# Patient Record
Sex: Male | Born: 1955
Health system: Southern US, Community
[De-identification: ages and names within clinical notes are randomized; demographics above are authoritative.]

## PROBLEM LIST (undated history)

## (undated) DIAGNOSIS — I10 Essential (primary) hypertension: Secondary | ICD-10-CM

## (undated) DIAGNOSIS — I509 Heart failure, unspecified: Secondary | ICD-10-CM

## (undated) DIAGNOSIS — J45909 Unspecified asthma, uncomplicated: Secondary | ICD-10-CM

## (undated) DIAGNOSIS — E7800 Pure hypercholesterolemia, unspecified: Secondary | ICD-10-CM

## (undated) DIAGNOSIS — M1990 Unspecified osteoarthritis, unspecified site: Secondary | ICD-10-CM

## (undated) HISTORY — PX: CORONARY ANGIOPLASTY WITH STENT PLACEMENT: SHX49

---

## 2018-04-04 ENCOUNTER — Encounter (HOSPITAL_COMMUNITY): Payer: Self-pay | Admitting: Emergency Medicine

## 2018-04-04 ENCOUNTER — Emergency Department (HOSPITAL_COMMUNITY)
Admission: EM | Admit: 2018-04-04 | Discharge: 2018-04-04 | Disposition: A | Discharge status: Home / Self Care | Payer: Self-pay | Attending: Emergency Medicine | Admitting: Emergency Medicine

## 2018-04-04 DIAGNOSIS — I110 Hypertensive heart disease with heart failure: Secondary | ICD-10-CM | POA: Insufficient documentation

## 2018-04-04 DIAGNOSIS — J45909 Unspecified asthma, uncomplicated: Secondary | ICD-10-CM | POA: Insufficient documentation

## 2018-04-04 DIAGNOSIS — I509 Heart failure, unspecified: Secondary | ICD-10-CM | POA: Insufficient documentation

## 2018-04-04 DIAGNOSIS — F17210 Nicotine dependence, cigarettes, uncomplicated: Secondary | ICD-10-CM | POA: Insufficient documentation

## 2018-04-04 DIAGNOSIS — G510 Bell's palsy: Secondary | ICD-10-CM | POA: Insufficient documentation

## 2018-04-04 HISTORY — DX: Heart failure, unspecified: I50.9

## 2018-04-04 HISTORY — DX: Essential (primary) hypertension: I10

## 2018-04-04 HISTORY — DX: Unspecified osteoarthritis, unspecified site: M19.90

## 2018-04-04 HISTORY — DX: Unspecified asthma, uncomplicated: J45.909

## 2018-04-04 HISTORY — DX: Pure hypercholesterolemia, unspecified: E78.00

## 2018-04-04 MED ORDER — LISINOPRIL 30 MG PO TABS: 30.0000 mg | ORAL_TABLET | Freq: Every day | ORAL | 0 refills | Status: DC

## 2018-04-04 MED ORDER — METOPROLOL TARTRATE 25 MG PO TABS: 25.0000 mg | ORAL_TABLET | Freq: Two times a day (BID) | ORAL | 0 refills | Status: DC

## 2018-04-04 MED ORDER — PREDNISONE 20 MG PO TABS: 60.0000 mg | ORAL_TABLET | Freq: Every day | ORAL | 0 refills | Status: AC

## 2018-04-04 MED ORDER — VALACYCLOVIR HCL 1 G PO TABS: 1000.0000 mg | ORAL_TABLET | Freq: Three times a day (TID) | ORAL | 0 refills | Status: AC

## 2018-04-04 MED ORDER — ALBUTEROL SULFATE HFA 108 (90 BASE) MCG/ACT IN AERS: 1.0000 | INHALATION_SPRAY | Freq: Four times a day (QID) | RESPIRATORY_TRACT | 0 refills | Status: AC | PRN

## 2018-04-04 NOTE — Discharge Instructions (Signed)
You were seen in the ED today with weakness in the left face. This is not from a stroke and is likely due to a viral infection causing the weakness. We are starting anti-viral medication, steroids, and have discussed eye care. You will need to follow up with a primary care physician and Neurologist as listed. I am refilling some of your medications until you are able to see your PCP. Tape the eye closed at night as demonstrated here.   Return to the ED with any severe eye pain, vision, change, worsening symptoms, or of your weakness spreads to the left arm or leg. You should also return if numbness develops.

## 2018-04-04 NOTE — ED Notes (Signed)
Pt stable, ambulatory, states understanding of discharge instructions 

## 2018-04-04 NOTE — ED Provider Notes (Signed)
Emergency Department Provider Note   I have reviewed the triage vital signs and the nursing notes.   HISTORY  Chief Complaint Facial Swelling   HPI Jacob Hopkins is a 62 y.o. male with PMH of HTN, HLD, and asthma [place] to the emergency department with 3 to 4 days of acute onset left face weakness.  Symptoms began suddenly without obvious provoking factors.  Patient does not have numbness to the left face.  He is not experiencing any weakness or numbness in the left arm or leg.  He describes some tightness over his shoulders and right leg.  Patient recently moved from [place] and has not established with a primary care physician.  He has no prior history of stroke.  He does feel like it is very difficult to close his left eye and that may be drying out.  He has not experienced any vision changes or drainage from the eye.  He also finds that liquids are falling from his mouth when drinking from the left side.  Ear discomfort. No rash.   Past Medical History:  Diagnosis Date  . Arthritis   . Asthma   . CHF (congestive heart failure) (HCC)   . High cholesterol   . Hypertension     There are no active problems to display for this patient.   Allergies Patient has no known allergies.  No family history on file.  Social History Social History   Tobacco Use  . Smoking status: Current Every Day Smoker  . Smokeless tobacco: Never Used  Substance Use Topics  . Alcohol use: Not on file  . Drug use: Not on file    Review of Systems  Constitutional: No fever/chills Eyes: No visual changes. Positive left eye dryness.  ENT: No sore throat. Cardiovascular: Denies chest pain. Respiratory: Denies shortness of breath. Gastrointestinal: No abdominal pain.  No nausea, no vomiting.  No diarrhea.  No constipation. Genitourinary: Negative for dysuria. Musculoskeletal: Negative for back pain. Skin: Negative for rash. Neurological: Negative for headaches or numbness. Positive left  face weakness.   10-point ROS otherwise negative.  ____________________________________________   PHYSICAL EXAM:  VITAL SIGNS: ED Triage Vitals  Enc Vitals Group     BP 04/04/18 1240 (!) 167/103     Pulse Rate 04/04/18 1240 80     Resp 04/04/18 1240 16     Temp 04/04/18 1240 98.7 F (37.1 C)     Temp Source 04/04/18 1240 Oral     SpO2 04/04/18 1240 99 %     Pain Score 04/04/18 1239 7   Constitutional: Alert and oriented. Well appearing and in no acute distress. Eyes: Conjunctivae are normal. PERRL. EOMI. Unable to close the left eye.  Head: Atraumatic. Nose: No congestion/rhinnorhea. Mouth/Throat: Mucous membranes are moist.   Neck: No stridor.   Cardiovascular: Normal rate, regular rhythm. Good peripheral circulation. Grossly normal heart sounds.   Respiratory: Normal respiratory effort.  No retractions. Lungs CTAB. Gastrointestinal: Soft and nontender. No distention.  Musculoskeletal: No lower extremity tenderness nor edema. No gross deformities of extremities. Neurologic: Patient with dense left face weakness involving the forehead and left eyebrows. Normal sensation. Normal tongue protrusion. Unable to close the left eye. Normal strength and sensation in the left upper and lower extremity. No pronator drift. Normal finger-to-nose testing. Normal gait.  Skin:  Skin is warm, dry and intact. No rash noted. ____________________________________________  EKG   EKG Interpretation  Date/Time:  Sunday April 04 2018 12:51:11 EST Ventricular Rate:  40  PR Interval:  126 QRS Duration: 86 QT Interval:  386 QTC Calculation: 440 R Axis:   69 Text Interpretation:  Normal sinus rhythm Possible Left atrial enlargement Left ventricular hypertrophy Abnormal ECG No STEMI.  Confirmed by Alona BeneLong, Jaylenne Hamelin [phone] on 04/04/2018 12:56:36 PM       ____________________________________________   PROCEDURES  Procedure(s) performed:    Procedures  None ____________________________________________   INITIAL IMPRESSION / ASSESSMENT AND PLAN / ED COURSE  Pertinent labs & imaging results that were available during my care of the patient were reviewed by me and considered in my medical decision making (see chart for details).  Patient presents to the emergency department with 3 days of left face weakness.  Exam is consistent with Bell's palsy.  There is dense motor deficit of the left face involving the forehead.  Patient is unable to close his left eye but is not having significant pain.  He is having some mild dryness.  No weakness or numbness in the left upper or lower extremity.  Normal sensation of the face bilaterally.  No ear symptoms, rash, or tenderness.  [place] and has not established with a PCP.  I discussed my impression of Bell's palsy.  Not feel the patient requires any neuroimaging at this time.  Discussed eye care at home and provided patient with eyepatch to help keep the eye closed at night only.  He will use moisturizing drops during the day.  I will provide steroid and antiviral medication.  I have also provided information for local primary care physician as well as neurology.  Discussed ED return precautions.  I did refill the patient's medications for blood pressure and asthma.  EKG reviewed as above with findings consistent with LVH. Discussed ED return precautions in detail.   ____________________________________________  FINAL CLINICAL IMPRESSION(S) / ED DIAGNOSES  Final diagnoses:  Bell's palsy    NEW OUTPATIENT MEDICATIONS STARTED DURING THIS VISIT:  New Prescriptions   ALBUTEROL (PROVENTIL HFA;VENTOLIN HFA) 108 (90 BASE) MCG/ACT INHALER    Inhale 1-2 puffs into the lungs every 6 (six) hours as needed for wheezing or shortness of breath.   LISINOPRIL (PRINIVIL,ZESTRIL) 30 MG TABLET    Take 1 tablet (30 mg total) by mouth daily.   METOPROLOL TARTRATE (LOPRESSOR) 25 MG TABLET     Take 1 tablet (25 mg total) by mouth 2 (two) times daily.   PREDNISONE (DELTASONE) 20 MG TABLET    Take 3 tablets (60 mg total) by mouth daily for 7 days.   VALACYCLOVIR (VALTREX) 1000 MG TABLET    Take 1 tablet (1,000 mg total) by mouth 3 (three) times daily for 7 days.    Note:  This document was prepared using Dragon voice recognition software and may include unintentional dictation errors.  Alona BeneJoshua Aliena Ghrist, MD Emergency Medicine    Brentlee Delage, Arlyss RepressJoshua G, MD 04/04/18 [phone]

## 2018-04-04 NOTE — ED Triage Notes (Addendum)
Face is drooping x 3 days left eye will not close tingling in left arm since droop , though it was a tooth. , speech is slurred, feels like shoulders are weak

## 2018-04-06 ENCOUNTER — Encounter: Payer: Self-pay | Admitting: Family Medicine

## 2018-04-06 ENCOUNTER — Ambulatory Visit: Payer: Self-pay | Attending: Family Medicine | Admitting: Family Medicine

## 2018-04-06 VITALS — BP 150/98 | HR 75 | Temp 97.6°F | Wt 154.6 lb

## 2018-04-06 DIAGNOSIS — M1990 Unspecified osteoarthritis, unspecified site: Secondary | ICD-10-CM | POA: Insufficient documentation

## 2018-04-06 DIAGNOSIS — G510 Bell's palsy: Secondary | ICD-10-CM | POA: Insufficient documentation

## 2018-04-06 DIAGNOSIS — Z955 Presence of coronary angioplasty implant and graft: Secondary | ICD-10-CM | POA: Insufficient documentation

## 2018-04-06 DIAGNOSIS — Z7952 Long term (current) use of systemic steroids: Secondary | ICD-10-CM | POA: Insufficient documentation

## 2018-04-06 DIAGNOSIS — I509 Heart failure, unspecified: Secondary | ICD-10-CM | POA: Insufficient documentation

## 2018-04-06 DIAGNOSIS — Z9889 Other specified postprocedural states: Secondary | ICD-10-CM | POA: Insufficient documentation

## 2018-04-06 DIAGNOSIS — Z79899 Other long term (current) drug therapy: Secondary | ICD-10-CM | POA: Insufficient documentation

## 2018-04-06 DIAGNOSIS — J449 Chronic obstructive pulmonary disease, unspecified: Secondary | ICD-10-CM | POA: Insufficient documentation

## 2018-04-06 DIAGNOSIS — I110 Hypertensive heart disease with heart failure: Secondary | ICD-10-CM | POA: Insufficient documentation

## 2018-04-06 DIAGNOSIS — I10 Essential (primary) hypertension: Secondary | ICD-10-CM | POA: Insufficient documentation

## 2018-04-06 MED FILL — LISINOPRIL 30 MG TABLET: 30 | 30 days supply | Qty: 30 | Fill #0

## 2018-04-06 MED FILL — METOPROLOL TARTRATE 25 MG T: 25 | 30 days supply | Qty: 60 | Fill #0

## 2018-04-06 MED FILL — predniSONE 20 MG TABS: 20 | 7 days supply | Qty: 21 | Fill #0

## 2018-04-06 MED FILL — ALBUTEROL SULFATE HFA 108 (: 108 (90 BAS | 25 days supply | Qty: 18 | Fill #0

## 2018-04-06 MED FILL — valACYclovir HCL 1 GM TABS: 1 | 7 days supply | Qty: 21 | Fill #0

## 2018-04-06 NOTE — Progress Notes (Signed)
Subjective:  Patient ID: Jacob Hopkins, male    DOB: 08/28/1955  Age: 62 y.o. MRN: [id number]  CC: Hospitalization Follow-up   HPI Jacob Hopkins is a 62 year old male recently relocated from [place] with a history of hypertension, tobacco abuse who presents today to establish care after recent ED visit [facility] ED on 04/04/2018 where he was diagnosed with Bell's palsy. Symptoms commenced 6 days ago and he was prescribed prednisone, valacyclovir which he is yet to pick up from the pharmacy.  His antihypertensive - metoprolol was prescribed as well which he is yet to pick up. He denies chest pains, shortness of breath, recent falls, weakness of his extremities.  Past Medical History:  Diagnosis Date  . Arthritis   . Asthma   . CHF (congestive heart failure) (HCC)   . High cholesterol   . Hypertension     Past Surgical History:  Procedure Laterality Date  . CORONARY ANGIOPLASTY WITH STENT PLACEMENT      No Known Allergies   Outpatient Medications Prior to Visit  Medication Sig Dispense Refill  . albuterol (PROVENTIL HFA;VENTOLIN HFA) 108 (90 Base) MCG/ACT inhaler Inhale 1-2 puffs into the lungs every 6 (six) hours as needed for wheezing or shortness of breath. 1 Inhaler 0  . lisinopril (PRINIVIL,ZESTRIL) 30 MG tablet Take 1 tablet (30 mg total) by mouth daily. 30 tablet 0  . metoprolol tartrate (LOPRESSOR) 25 MG tablet Take 1 tablet (25 mg total) by mouth 2 (two) times daily. 60 tablet 0  . predniSONE (DELTASONE) 20 MG tablet Take 3 tablets (60 mg total) by mouth daily for 7 days. 21 tablet 0  . valACYclovir (VALTREX) 1000 MG tablet Take 1 tablet (1,000 mg total) by mouth 3 (three) times daily for 7 days. 21 tablet 0   No facility-administered medications prior to visit.     ROS Review of Systems  Constitutional: Negative for activity change and appetite change.  HENT: Negative for sinus pressure and sore throat.   Eyes: Negative for visual disturbance.  Respiratory:  Negative for cough, chest tightness and shortness of breath.   Cardiovascular: Negative for chest pain and leg swelling.  Gastrointestinal: Negative for abdominal distention, abdominal pain, constipation and diarrhea.  Endocrine: Negative.   Genitourinary: Negative for dysuria.  Musculoskeletal: Negative for joint swelling and myalgias.  Skin: Negative for rash.  Allergic/Immunologic: Negative.   Neurological: Positive for facial asymmetry. Negative for seizures, weakness, light-headedness and numbness.  Psychiatric/Behavioral: Negative for dysphoric mood and suicidal ideas.    Objective:  BP (!) 150/98   Pulse 75   Temp 97.6 F (36.4 C) (Oral)   Wt 154 lb 9.6 oz (70.1 kg)   SpO2 100%   BP/Weight 04/06/2018 04/04/2018  Systolic BP 150 167  Diastolic BP 98 103  Wt. (Lbs) 154.6 -      Physical Exam Constitutional:      Appearance: He is well-developed.  Cardiovascular:     Rate and Rhythm: Normal rate.     Heart sounds: Normal heart sounds. No murmur.  Pulmonary:     Effort: Pulmonary effort is normal.     Breath sounds: Normal breath sounds. No wheezing or rales.  Chest:     Chest wall: No tenderness.  Abdominal:     General: Bowel sounds are normal. There is no distension.     Palpations: Abdomen is soft. There is no mass.     Tenderness: There is no abdominal tenderness.  Musculoskeletal: Normal range of motion.  Neurological:  Mental Status: He is alert and oriented to person, place, and time.     Comments: Right facial nerve palsy  Psychiatric:        Mood and Affect: Mood normal.      Assessment & Plan:   1. Bell's palsy Advised to pick up medications from the pharmacy Consider referral for physical therapy and speech therapy at his next visit meanwhile advised to apply for the [facility] financial discount to facilitate this referral  2. Essential hypertension Uncontrolled due to not taking antihypertensives Advised to pick up prescription from  pharmacy  3. COPD mixed type (HCC) Stable on Proventil prn Smoking cessation discussed    No orders of the defined types were placed in this encounter.   Follow-up: Return in about 1 month (around 05/07/2018) for follow up on Bell's palsy.   Hoy RegisterEnobong Kirston Luty MD

## 2018-04-16 ENCOUNTER — Inpatient Hospital Stay: Payer: Self-pay

## 2018-04-23 ENCOUNTER — Ambulatory Visit: Payer: Self-pay | Attending: Internal Medicine | Admitting: Internal Medicine

## 2018-04-23 ENCOUNTER — Ambulatory Visit: Payer: Self-pay

## 2018-04-23 ENCOUNTER — Encounter: Payer: Self-pay | Admitting: Internal Medicine

## 2018-04-23 VITALS — BP 164/97 | HR 60 | Temp 97.6°F | Resp 16 | Wt 156.0 lb

## 2018-04-23 DIAGNOSIS — M545 Low back pain, unspecified: Secondary | ICD-10-CM | POA: Insufficient documentation

## 2018-04-23 DIAGNOSIS — I10 Essential (primary) hypertension: Secondary | ICD-10-CM

## 2018-04-23 DIAGNOSIS — G510 Bell's palsy: Secondary | ICD-10-CM | POA: Insufficient documentation

## 2018-04-23 DIAGNOSIS — F17200 Nicotine dependence, unspecified, uncomplicated: Secondary | ICD-10-CM | POA: Insufficient documentation

## 2018-04-23 DIAGNOSIS — N401 Enlarged prostate with lower urinary tract symptoms: Secondary | ICD-10-CM | POA: Insufficient documentation

## 2018-04-23 DIAGNOSIS — G8929 Other chronic pain: Secondary | ICD-10-CM | POA: Insufficient documentation

## 2018-04-23 DIAGNOSIS — F1020 Alcohol dependence, uncomplicated: Secondary | ICD-10-CM

## 2018-04-23 DIAGNOSIS — F17210 Nicotine dependence, cigarettes, uncomplicated: Secondary | ICD-10-CM

## 2018-04-23 DIAGNOSIS — F329 Major depressive disorder, single episode, unspecified: Secondary | ICD-10-CM | POA: Insufficient documentation

## 2018-04-23 DIAGNOSIS — R350 Frequency of micturition: Secondary | ICD-10-CM | POA: Insufficient documentation

## 2018-04-23 DIAGNOSIS — Z1331 Encounter for screening for depression: Secondary | ICD-10-CM | POA: Insufficient documentation

## 2018-04-23 DIAGNOSIS — B182 Chronic viral hepatitis C: Secondary | ICD-10-CM | POA: Insufficient documentation

## 2018-04-23 DIAGNOSIS — Z79899 Other long term (current) drug therapy: Secondary | ICD-10-CM | POA: Insufficient documentation

## 2018-04-23 DIAGNOSIS — I509 Heart failure, unspecified: Secondary | ICD-10-CM | POA: Insufficient documentation

## 2018-04-23 DIAGNOSIS — E119 Type 2 diabetes mellitus without complications: Secondary | ICD-10-CM | POA: Insufficient documentation

## 2018-04-23 DIAGNOSIS — Z955 Presence of coronary angioplasty implant and graft: Secondary | ICD-10-CM | POA: Insufficient documentation

## 2018-04-23 DIAGNOSIS — I110 Hypertensive heart disease with heart failure: Secondary | ICD-10-CM | POA: Insufficient documentation

## 2018-04-23 DIAGNOSIS — J449 Chronic obstructive pulmonary disease, unspecified: Secondary | ICD-10-CM | POA: Insufficient documentation

## 2018-04-23 MED ORDER — BUDESONIDE-FORMOTEROL FUMARATE 160-4.5 MCG/ACT IN AERO: 2.0000 | INHALATION_SPRAY | Freq: Two times a day (BID) | RESPIRATORY_TRACT | 3 refills | Status: AC

## 2018-04-23 MED ORDER — GABAPENTIN 300 MG PO CAPS: 300.0000 mg | ORAL_CAPSULE | Freq: Two times a day (BID) | ORAL | 3 refills | Status: AC

## 2018-04-23 MED ORDER — BUPROPION HCL ER (SR) 100 MG PO TB12: 100.0000 mg | ORAL_TABLET | Freq: Two times a day (BID) | ORAL | 2 refills | Status: AC

## 2018-04-23 MED ORDER — ATORVASTATIN CALCIUM 20 MG PO TABS: 20.0000 mg | ORAL_TABLET | Freq: Every day | ORAL | 3 refills | Status: AC

## 2018-04-23 MED ORDER — TAMSULOSIN HCL 0.4 MG PO CAPS: 0.4000 mg | ORAL_CAPSULE | Freq: Every day | ORAL | 3 refills | Status: AC

## 2018-04-23 MED ORDER — HYPROMELLOSE (GONIOSCOPIC) 2.5 % OP SOLN: 1.0000 [drp] | Freq: Three times a day (TID) | OPHTHALMIC | 1 refills | Status: AC | PRN

## 2018-04-23 NOTE — Progress Notes (Signed)
Patient ID: Jacob Hopkins, male    DOB: December 29, 1955  MRN: [id number]  CC: Follow-up   Subjective: Jacob Hopkins is a 63 y.o. male who presents for f/u Bell's Palsy.  PCP is Dr. Alvis Hopkins. His concerns today include:  Patient with history of tobacco dependence, COPD, HTN, Bell's palsy, CHF, HL, BPH  Patient was diagnosed with Bell's palsy affecting the LT side of face through the ER on 04/04/2018.  He was seen by Dr. Alvis Hopkins in follow-up on 04/06/2018.  At that time he had not picked up prescription for valacyclovir and prednisone. He has completed both meds.  Noticed "a little improvement."  Not able to close LT eye completely.  Purchased an eye patch from [employer] to wear at nights but it irritates his eye.  Eye feels dry and burns during the day.  Has problems drinking fluids due to drainage from LT side of mouth.   HTN: did not take meds as yet for the morning but reports compliance with meds.  Limits salt in foods.  No LE edema.  No CP. Some SOB when moves around too much which he attributes to COPD.  Uses Albuterol inh BID.  Was on Jacob Hopkins prior to relocation.  Smokes 1/2 pk a day for past 40 yrs.  Quit one time for 6 mhs.  Tried patches in past without success. Would like to quit smoking   Hx of Hep C dx 5 yrs ago.  Never treated. Hx of ETOH abuse and marijuana.  Drinks 1 pint of ETOH a day.  No hx of IVDA.   Never did detox or treatment program for ETOH abuse but would like to.   Also gives hx of DM.  Was on Jacob Hopkins up to 1 yr ago.  Taken off Jacob Hopkins and now dietic control.  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  Was on Jacob Hopkins 800 mg TID but out x 1 mth  He is requesting RF on meds he was on prior to relocation from Jacob Hopkins including Jacob Hopkins, Jacob Hopkins, Jacob Hopkins and Jacob Hopkins  Patient Active Problem List   Diagnosis Date Noted  . Tobacco dependence 04/23/2018  . Positive depression screening 04/23/2018  . Alcohol use disorder, moderate,  dependence (HCC) 04/23/2018  . Benign prostatic hyperplasia with urinary frequency 04/23/2018  . Controlled type 2 diabetes mellitus without complication, without long-term current use of insulin (HCC) 04/23/2018  . Chronic midline low back pain without sciatica 04/23/2018  . Hep C w/o coma, chronic (HCC) 04/23/2018  . Hypertension 04/06/2018  . COPD mixed type (HCC) 04/06/2018     Current Outpatient Medications on File Prior to Visit  Medication Sig Dispense Refill  . albuterol (PROVENTIL HFA;VENTOLIN HFA) 108 (90 Base) MCG/ACT inhaler Inhale 1-2 puffs into the lungs every 6 (six) hours as needed for wheezing or shortness of breath. 1 Inhaler 0  . Jacob Hopkins (PRINIVIL,ZESTRIL) 30 MG tablet Take 1 tablet (30 mg total) by mouth daily. 30 tablet 0  . Jacob Hopkins (LOPRESSOR) 25 MG tablet Take 1 tablet (25 mg total) by mouth 2 (two) times daily. 60 tablet 0   No current facility-administered medications on file prior to visit.     No Known Allergies  Social History   Socioeconomic History  . Marital status: Single    Spouse name: Not on file  . Number of children: Not on file  . Years of education: Not on file  . Highest education level: Not on file  Occupational  History  . Not on file  Social Needs  . Financial resource strain: Not on file  . Food insecurity:    Worry: Not on file    Inability: Not on file  . Transportation needs:    Medical: Not on file    Non-medical: Not on file  Tobacco Use  . Smoking status: Current Every Day Smoker  . Smokeless tobacco: Never Used  Substance and Sexual Activity  . Alcohol use: Not on file  . Drug use: Not on file  . Sexual activity: Not on file  Lifestyle  . Physical activity:    Days per week: Not on file    Minutes per session: Not on file  . Stress: Not on file  Relationships  . Social connections:    Talks on phone: Not on file    Gets together: Not on file    Attends religious service: Not on file    Active  member of club or organization: Not on file    Attends meetings of clubs or organizations: Not on file    Relationship status: Not on file  . Intimate partner violence:    Fear of current or ex partner: Not on file    Emotionally abused: Not on file    Physically abused: Not on file    Forced sexual activity: Not on file  Other Topics Concern  . Not on file  Social History Narrative  . Not on file    No family history on file.  Past Surgical History:  Procedure Laterality Date  . CORONARY ANGIOPLASTY WITH STENT PLACEMENT      ROS: Review of Systems  Genitourinary: Negative for frequency and urgency.       Hx of BPH.  Out of Jacob Hopkins for over 1 mth    PHYSICAL EXAM: BP (!) 164/97   Pulse 60   Temp 97.6 F (36.4 C) (Oral)   Resp 16   Wt 156 lb (70.8 kg)   SpO2 98%   Physical Exam  General appearance - alert, well appearing, and in no distress Mental status - normal mood, behavior, speech, dress, motor activity, and thought processes Mouth - mucous membranes moist, pharynx normal without lesions .patient has several teeth broken off in the gum. Neck - supple, no significant adenopathy Chest - clear to auscultation, no wheezes, rales or rhonchi, symmetric air entry Heart - normal rate, regular rhythm, normal S1, S2, no murmurs, rubs, clicks or gallops Neurological -noted left-sided facial weakness.  He is unable to completely close the left eye. Extremities -no lower extremity edema  Depression screen [facility] 2/9 04/23/2018  Decreased Interest 1  Down, Depressed, Hopeless 3  PHQ - 2 Score 4  Altered sleeping 3  Tired, decreased energy 3  Change in appetite 3  Feeling bad or failure about yourself  3  Trouble concentrating 3  Moving slowly or fidgety/restless 3  Suicidal thoughts 0  PHQ-9 Score 22    ASSESSMENT AND PLAN: 1. Bell's palsy Prescribed artificial tears to use as needed. Advised to use a straw when drinking fluids. Patient advised that most people improve  to baseline or close to baseline at least 3 months out.  If no significant improvement at 3 months, he will need further work-up - hydroxypropyl methylcellulose / hypromellose (ISOPTO TEARS / GONIOVISC) 2.5 % ophthalmic solution; Place 1 drop into the left eye 3 (three) times daily as needed for dry eyes.  Dispense: 15 mL; Refill: 1  2. Essential hypertension Not at  goal but patient has not taken Jacob or Jacob Hopkins as yet for today.  Encourage compliance with medications and low-salt diet. - CBC - Lipid panel - Comprehensive metabolic panel  3. COPD mixed type (HCC) Continue albuterol as needed. Advised to quit smoking - budesonide-formoterol (Jacob Hopkins) 160-4.5 MCG/ACT inhaler; Inhale 2 puffs into the lungs 2 (two) times daily.  Dispense: 1 Inhaler; Refill: 3  4. Tobacco dependence Patient advised to quit smoking. Discussed health risks associated with smoking including lung and other types of cancers, chronic lung diseases and CV risks.. Pt ready to give trail of quitting.  Discussed methods to help quit including quitting cold [place], use of NRT, Chantix and Bupropion.  He is willing to try bupropion. - buPROPion (WELLBUTRIN SR) 100 MG 12 hr tablet; Take 1 tablet (100 mg total) by mouth 2 (two) times daily.  Dispense: 60 tablet; Refill: 2  5. Positive depression screening We did not get to discuss this in much detail.  However patient started on bupropion to help with smoking sensation and should also help with any underlying depression.  We will have him follow-up with our LCSW - buPROPion (WELLBUTRIN SR) 100 MG 12 hr tablet; Take 1 tablet (100 mg total) by mouth 2 (two) times daily.  Dispense: 60 tablet; Refill: 2  6. Alcohol use disorder, moderate, dependence (HCC) Discussed health risks associated with continued heavy alcohol use.  Patient interested in learning about treatment programs in the area.  Will defer till LCSW Discussed trying him on naltrexone to help decrease  cravings for alcohol.  Will check LFTs first.  If these are okay he can be prescribed naltrexone and follow-up visit with PCP  7. Benign prostatic hyperplasia with urinary frequency - tamsulosin (Jacob Hopkins) 0.4 MG CAPS capsule; Take 1 capsule (0.4 mg total) by mouth daily.  Dispense: 30 capsule; Refill: 3  8. Controlled type 2 diabetes mellitus without complication, without long-term current use of insulin (HCC) - Hemoglobin A1c Discussed the importance of healthy eating habits and regular exercise.  9. Chronic midline low back pain without sciatica - Jacob Hopkins (NEURONTIN) 300 MG capsule; Take 1 capsule (300 mg total) by mouth 2 (two) times daily.  Dispense: 60 capsule; Refill: 3  10. Hep C w/o coma, chronic (HCC) Patient has completed forms for [facility] card/[facility] discount.  He will be meeting with our financial specialist today.  Can be referred to infectious disease for treatment consideration once he is approved for the [facility] card or the [facility] discount card. - HCV RNA quant rflx ultra or genotyp([facility]/Sunquest)   Patient was given the opportunity to ask questions.  Patient verbalized understanding of the plan and was able to repeat key elements of the plan.   Orders Placed This Encounter  Procedures  . CBC  . Lipid panel  . Comprehensive metabolic panel  . HCV RNA quant rflx ultra or genotyp([facility]/Sunquest)  . Hemoglobin A1c     Requested Prescriptions   Signed Prescriptions Disp Refills  . budesonide-formoterol (Jacob Hopkins) 160-4.5 MCG/ACT inhaler 1 Inhaler 3    Sig: Inhale 2 puffs into the lungs 2 (two) times daily.  . tamsulosin (Jacob Hopkins) 0.4 MG CAPS capsule 30 capsule 3    Sig: Take 1 capsule (0.4 mg total) by mouth daily.  Marland Kitchen atorvastatin (LIPITOR) 20 MG tablet 90 tablet 3    Sig: Take 1 tablet (20 mg total) by mouth daily.  Marland Kitchen Jacob Hopkins (NEURONTIN) 300 MG capsule 60 capsule 3    Sig: Take 1 capsule (300 mg total) by mouth  2 (two) times daily.  Marland Kitchen buPROPion (WELLBUTRIN SR)  100 MG 12 hr tablet 60 tablet 2    Sig: Take 1 tablet (100 mg total) by mouth 2 (two) times daily.  . hydroxypropyl methylcellulose / hypromellose (ISOPTO TEARS / GONIOVISC) 2.5 % ophthalmic solution 15 mL 1    Sig: Place 1 drop into the left eye 3 (three) times daily as needed for dry eyes.    Return in about 1 month (around 05/24/2018) for [place].  Jonah Blue, MD, FACP

## 2018-04-23 NOTE — Patient Instructions (Signed)
Please use the artificial tears to the left eye as needed to prevent drying of the eye.  Use a straw to drink fluids.  Start Wellbutrin twice a day to help decrease cravings for cigarettes and also to help with depression.  I will have our social worker contact you regarding treatment programs to help you quit drinking.  Please sign a release for us to get your medical records from your previous primary doctor in [place].

## 2018-04-26 LAB — HEMOGLOBIN A1C: Est. average glucose Bld gHb Est-mCnc: <74 mg/dL

## 2018-04-26 LAB — COMPREHENSIVE METABOLIC PANEL
ALT: 30 IU/L (ref 0–44)
AST: 31 IU/L (ref 0–40)
Albumin/Globulin Ratio: 1.6 (ref 1.2–2.2)
Albumin: 4.1 g/dL (ref 3.6–4.8)
Alkaline Phosphatase: 80 IU/L (ref 39–117)
BUN/Creatinine Ratio: 12 (ref 10–24)
BUN: 13 mg/dL (ref 8–27)
Bilirubin Total: 0.7 mg/dL (ref 0.0–1.2)
CO2: 18 mmol/L — AB (ref 20–29)
Calcium: 9.3 mg/dL (ref 8.6–10.2)
Chloride: 107 mmol/L — ABNORMAL HIGH (ref 96–106)
Creatinine, Ser: 1.11 mg/dL (ref 0.76–1.27)
GFR calc Af Amer: 82 mL/min/{1.73_m2} (ref >59)
GFR calc non Af Amer: 71 mL/min/{1.73_m2} (ref >59)
Globulin, Total: 2.5 g/dL (ref 1.5–4.5)
Glucose: 97 mg/dL (ref 65–99)
Potassium: 4.2 mmol/L (ref 3.5–5.2)
Sodium: 141 mmol/L (ref 134–144)
Total Protein: 6.6 g/dL (ref 6.0–8.5)

## 2018-04-26 LAB — CBC
Hematocrit: 41.8 % (ref 37.5–51.0)
Hemoglobin: 14.8 g/dL (ref 13.0–17.7)
MCH: 33.2 pg — ABNORMAL HIGH (ref 26.6–33.0)
MCHC: 35.4 g/dL (ref 31.5–35.7)
MCV: 94 fL (ref 79–97)
Platelets: 217 10*3/uL (ref 150–450)
RBC: 4.46 x10E6/uL (ref 4.14–5.80)
RDW: 13.5 % (ref 12.3–15.4)
WBC: 4.5 10*3/uL (ref 3.4–10.8)

## 2018-04-26 LAB — LIPID PANEL
Chol/HDL Ratio: 4.3 ratio (ref 0.0–5.0)
Cholesterol, Total: 205 mg/dL — ABNORMAL HIGH (ref 100–199)
HDL: 48 mg/dL (ref >39)
LDL Calculated: 140 mg/dL — ABNORMAL HIGH (ref 0–99)
Triglycerides: 84 mg/dL (ref 0–149)
VLDL CHOLESTEROL CAL: 17 mg/dL (ref 5–40)

## 2018-04-26 LAB — HCV RNA QUANT RFLX ULTRA OR GENOTYP
HCV Quant Baseline: 7330000 IU/mL
HCV log10: 6.865 log10 IU/mL

## 2018-04-26 LAB — HEPATITIS C GENOTYPE

## 2018-05-04 ENCOUNTER — Telehealth: Payer: Self-pay

## 2018-05-04 NOTE — Telephone Encounter (Signed)
Contacted pt to go over lab results pt didn't answer and was unable to lvm due to mailbox not being set up 

## 2018-05-26 ENCOUNTER — Ambulatory Visit: Payer: Self-pay | Admitting: Family Medicine

## 2018-06-22 ENCOUNTER — Ambulatory Visit: Payer: Self-pay | Admitting: Family Medicine

## 2018-06-23 ENCOUNTER — Telehealth: Payer: Self-pay | Admitting: *Deleted

## 2018-06-23 NOTE — Telephone Encounter (Signed)
Patient no showed for their most recent appointment 06/22/2018. Please ask if there were any barriers causing the no show and offer the next available appointment.   

## 2020-04-23 ENCOUNTER — Other Ambulatory Visit: Payer: Self-pay

## 2020-04-23 ENCOUNTER — Emergency Department (HOSPITAL_COMMUNITY)
Admission: EM | Admit: 2020-04-23 | Discharge: 2020-04-23 | Disposition: A | Discharge status: Home / Self Care | Payer: Medicaid Other | Attending: Emergency Medicine | Admitting: Emergency Medicine

## 2020-04-23 ENCOUNTER — Emergency Department (HOSPITAL_COMMUNITY): Payer: Medicaid Other

## 2020-04-23 DIAGNOSIS — Z79899 Other long term (current) drug therapy: Secondary | ICD-10-CM | POA: Diagnosis not present

## 2020-04-23 DIAGNOSIS — F1920 Other psychoactive substance dependence, uncomplicated: Secondary | ICD-10-CM | POA: Diagnosis not present

## 2020-04-23 DIAGNOSIS — F17200 Nicotine dependence, unspecified, uncomplicated: Secondary | ICD-10-CM | POA: Insufficient documentation

## 2020-04-23 DIAGNOSIS — K2900 Acute gastritis without bleeding: Secondary | ICD-10-CM | POA: Insufficient documentation

## 2020-04-23 DIAGNOSIS — E119 Type 2 diabetes mellitus without complications: Secondary | ICD-10-CM | POA: Diagnosis not present

## 2020-04-23 DIAGNOSIS — Z955 Presence of coronary angioplasty implant and graft: Secondary | ICD-10-CM | POA: Insufficient documentation

## 2020-04-23 DIAGNOSIS — I509 Heart failure, unspecified: Secondary | ICD-10-CM | POA: Insufficient documentation

## 2020-04-23 DIAGNOSIS — R072 Precordial pain: Secondary | ICD-10-CM | POA: Diagnosis not present

## 2020-04-23 DIAGNOSIS — Z20822 Contact with and (suspected) exposure to covid-19: Secondary | ICD-10-CM | POA: Diagnosis not present

## 2020-04-23 DIAGNOSIS — J449 Chronic obstructive pulmonary disease, unspecified: Secondary | ICD-10-CM | POA: Diagnosis not present

## 2020-04-23 DIAGNOSIS — R079 Chest pain, unspecified: Secondary | ICD-10-CM | POA: Diagnosis present

## 2020-04-23 DIAGNOSIS — I110 Hypertensive heart disease with heart failure: Secondary | ICD-10-CM | POA: Diagnosis not present

## 2020-04-23 DIAGNOSIS — J45909 Unspecified asthma, uncomplicated: Secondary | ICD-10-CM | POA: Insufficient documentation

## 2020-04-23 DIAGNOSIS — Z7951 Long term (current) use of inhaled steroids: Secondary | ICD-10-CM | POA: Diagnosis not present

## 2020-04-23 LAB — BASIC METABOLIC PANEL
Anion gap: 10 (ref 5–15)
BUN: 13 mg/dL (ref 8–23)
CO2: 23 mmol/L (ref 22–32)
Calcium: 9.2 mg/dL (ref 8.9–10.3)
Chloride: 103 mmol/L (ref 98–111)
Creatinine, Ser: 1.2 mg/dL (ref 0.61–1.24)
GFR, Estimated: >60 mL/min (ref >60)
Glucose, Bld: 106 mg/dL — ABNORMAL HIGH (ref 70–99)
Potassium: 3.6 mmol/L (ref 3.5–5.1)
Sodium: 136 mmol/L (ref 135–145)

## 2020-04-23 LAB — CBC
HCT: 43.7 % (ref 39.0–52.0)
Hemoglobin: 14.5 g/dL (ref 13.0–17.0)
MCH: 31.6 pg (ref 26.0–34.0)
MCHC: 33.2 g/dL (ref 30.0–36.0)
MCV: 95.2 fL (ref 80.0–100.0)
Platelets: 273 10*3/uL (ref 150–400)
RBC: 4.59 MIL/uL (ref 4.22–5.81)
RDW: 13.2 % (ref 11.5–15.5)
WBC: 4 10*3/uL (ref 4.0–10.5)
nRBC: 0 % (ref 0.0–0.2)

## 2020-04-23 LAB — TROPONIN I (HIGH SENSITIVITY)
Troponin I (High Sensitivity): 8 ng/L (ref <18)
Troponin I (High Sensitivity): 7 ng/L (ref <18)

## 2020-04-23 MED ORDER — ALUM & MAG HYDROXIDE-SIMETH 200-200-20 MG/5ML PO SUSP
30.0000 mL | Freq: Once | ORAL | Status: AC
  Administered 2020-04-23: 30 mL via ORAL
  Filled 2020-04-23: qty 30

## 2020-04-23 MED ORDER — CHLORDIAZEPOXIDE HCL 25 MG PO CAPS: ORAL_CAPSULE | ORAL | 0 refills | Status: DC

## 2020-04-23 NOTE — ED Provider Notes (Signed)
[facility] EMERGENCY DEPARTMENT Provider Note   CSN: [id number] Arrival date & time: 04/23/20  8144     History No chief complaint on file.   Jacob Hopkins is a 65 y.o. male.  HPI  HPI: A 65 year old patient with a history of CVA and hypertension presents for evaluation of chest pain. Initial onset of pain was more than 6 hours ago. The patient's chest pain is worse with exertion. The patient reports some diaphoresis. The patient's chest pain is not middle- or left-sided, is not well-localized, is not described as heaviness/pressure/tightness, is not sharp and does not radiate to the arms/jaw/neck. The patient does not complain of nausea. The patient has smoked in the past 90 days. The patient has no history of peripheral artery disease, denies any history of treated diabetes, has no relevant family history of coronary artery disease (first degree relative at less than age 65), has no history of hypercholesterolemia and does not have an elevated BMI (>=30).   Patient reports that his current chest pain has been off and on for the last 3 months.  Today the pain is more severe and lasting longer.  The chest pain is different than his heart attack pain.  The pain started on the right side, then it moved to the left side and now it is back on the right side.  The pain is not radiating to the back, neck, shoulders.  The pain improves with belching.  He does not think that the pain is worse with food intake, exertion, cough.  Patient has known history of CAD.  He admits to current use of cocaine and cigarette use.  No new leg pain, weakness, numbness.  Patient also admits to alcoholism and is wanting help with his drug use.  He has not seen his PCP in more than a year. Pt has no hx of PE, DVT and denies any exogenous hormone (testosterone / estrogen) use, long distance travels or surgery in the past 6 weeks, active cancer, recent immobilization.   Past Medical History:  Diagnosis  Date  . Arthritis   . Asthma   . CHF (congestive heart failure) (HCC)   . High cholesterol   . Hypertension     Patient Active Problem List   Diagnosis Date Noted  . Tobacco dependence 04/23/2018  . Positive depression screening 04/23/2018  . Alcohol use disorder, moderate, dependence (HCC) 04/23/2018  . Benign prostatic hyperplasia with urinary frequency 04/23/2018  . Controlled type 2 diabetes mellitus without complication, without long-term current use of insulin (HCC) 04/23/2018  . Chronic midline low back pain without sciatica 04/23/2018  . Hep C w/o coma, chronic (HCC) 04/23/2018  . Hypertension 04/06/2018  . COPD mixed type (HCC) 04/06/2018    Past Surgical History:  Procedure Laterality Date  . CORONARY ANGIOPLASTY WITH STENT PLACEMENT         No family history on file.  Social History   Tobacco Use  . Smoking status: Current Every Day Smoker  . Smokeless tobacco: Never Used    Home Medications Prior to Admission medications   Medication Sig Start Date End Date Taking? Authorizing Provider  chlordiazePOXIDE (LIBRIUM) 25 MG capsule 50mg  PO TID x 1D, then 25-50mg  PO BID X 1D, then 25-50mg  PO QD X 1D 04/23/20  Yes Jenyfer Trawick, MD  albuterol (PROVENTIL HFA;VENTOLIN HFA) 108 (90 Base) MCG/ACT inhaler Inhale 1-2 puffs into the lungs every 6 (six) hours as needed for wheezing or shortness of breath. 04/04/18  Long, Wonda Olds, MD  atorvastatin (LIPITOR) 20 MG tablet Take 1 tablet (20 mg total) by mouth daily. 04/23/18   Ladell Pier, MD  budesonide-formoterol (SYMBICORT) 160-4.5 MCG/ACT inhaler Inhale 2 puffs into the lungs 2 (two) times daily. 04/23/18   Ladell Pier, MD  buPROPion (WELLBUTRIN SR) 100 MG 12 hr tablet Take 1 tablet (100 mg total) by mouth 2 (two) times daily. 04/23/18   Ladell Pier, MD  gabapentin (NEURONTIN) 300 MG capsule Take 1 capsule (300 mg total) by mouth 2 (two) times daily. 04/23/18   Ladell Pier, MD  hydroxypropyl  methylcellulose / hypromellose (ISOPTO TEARS / GONIOVISC) 2.5 % ophthalmic solution Place 1 drop into the left eye 3 (three) times daily as needed for dry eyes. 04/23/18   Ladell Pier, MD  lisinopril (PRINIVIL,ZESTRIL) 30 MG tablet Take 1 tablet (30 mg total) by mouth daily. 04/04/18 05/04/18  Long, Wonda Olds, MD  metoprolol tartrate (LOPRESSOR) 25 MG tablet Take 1 tablet (25 mg total) by mouth 2 (two) times daily. 04/04/18 05/04/18  Long, Wonda Olds, MD  tamsulosin (FLOMAX) 0.4 MG CAPS capsule Take 1 capsule (0.4 mg total) by mouth daily. 04/23/18   Ladell Pier, MD    Allergies    Patient has no known allergies.  Review of Systems   Review of Systems  Constitutional: Positive for activity change. Negative for diaphoresis.  Respiratory: Positive for cough. Negative for shortness of breath.   Cardiovascular: Positive for chest pain.  Gastrointestinal: Negative for abdominal pain, nausea and vomiting.  All other systems reviewed and are negative.   Physical Exam Updated Vital Signs BP (!) 144/94   Pulse 66   Temp 98.4 F (36.9 C) (Oral)   Resp 19   SpO2 100%   Physical Exam Vitals and nursing note reviewed.  Constitutional:      Appearance: He is well-developed.  HENT:     Head: Atraumatic.  Cardiovascular:     Rate and Rhythm: Normal rate.     Comments: 2+ and equal radial pulse, femoral pulse and dorsalis pedis Pulmonary:     Effort: Pulmonary effort is normal.     Breath sounds: No wheezing.  Musculoskeletal:     Cervical back: Neck supple.     Right lower leg: No edema.     Left lower leg: No edema.  Skin:    General: Skin is warm.  Neurological:     Mental Status: He is alert and oriented to person, place, and time.     ED Results / Procedures / Treatments   Labs (all labs ordered are listed, but only abnormal results are displayed) Labs Reviewed  BASIC METABOLIC PANEL - Abnormal; Notable for the following components:      Result Value   Glucose, Bld  106 (*)    All other components within normal limits  SARS CORONAVIRUS 2 (TAT 6-24 HRS)  CBC  TROPONIN I (HIGH SENSITIVITY)  TROPONIN I (HIGH SENSITIVITY)    EKG EKG Interpretation  Date/Time:  Monday April 23 2020 08:42:44 EST Ventricular Rate:  86 PR Interval:  130 QRS Duration: 88 QT Interval:  384 QTC Calculation: 459 R Axis:   76 Text Interpretation: Normal sinus rhythm Biatrial enlargement Minimal voltage criteria for LVH, may be normal variant ( Sokolow-Lyon ) Abnormal ECG No acute changes No significant change since last tracing Confirmed by Varney Biles ([id number]) on 04/23/2020 7:20:11 PM   Radiology DG Chest 2 View  Result Date: 04/23/2020 CLINICAL DATA:  Chest pain. Additional history provided: Chest pain, shortness of breath, high blood pressure. EXAM: CHEST - 2 VIEW COMPARISON:  No pertinent prior exams available for comparison. FINDINGS: Heart size within normal limits. No appreciable airspace consolidation or pulmonary edema. No evidence of pleural effusion or pneumothorax. No acute bony abnormality is identified. IMPRESSION: No evidence of acute cardiopulmonary abnormality. Electronically Signed   By: Jackey Loge DO   On: 04/23/2020 09:22    Procedures Procedures (including critical care time)  Medications Ordered in ED Medications  alum & mag hydroxide-simeth (MAALOX/MYLANTA) 200-200-20 MG/5ML suspension 30 mL (30 mLs Oral Given 04/23/20 2005)    ED Course  I have reviewed the triage vital signs and the nursing notes.  Pertinent labs & imaging results that were available during my care of the patient were reviewed by me and considered in my medical decision making (see chart for details).    MDM Rules/Calculators/A&P HEAR Score: 50                        65 year old male comes in a chief complaint of chest pain.  The pain is atypical, and has been present since this morning.  It has been going off and on since last 3 months, but today it is more severe.   During my assessment patient reports that the pain is much improved.  He had called EMS, they gave him nitro and there was no response to the nitroglycerin.  He has history of CAD and he states that this current pain is different than his previous MI pain.  Pain is not pleuritic, exertional, positional but it is better with him belching.  With patient having ongoing cocaine use, smoking and known history of CAD, ACS is in the differential.  With him having heavy alcohol use history gastritis/PUD, esophageal spasm is in the differential diagnosis.  History is not consistent with PE.  Aortic dissection also considered in the differential however the pain is not radiating to the back, there is no tachycardia, hypertension and patient's pulses are equal and bounding for bilateral upper and lower extremities.  For now I think we will be conservative in our approach and have patient start taking PPI and have him return to his PCP.  Patient is insured, just has not followed up with community wellness like he did.  He also needs to start seeing cardiologist to ensure that he is getting appropriate medications to protect his heart.  I have put in a consult for peers support and have provided the patient with resources and a prescription for Librium for his alcohol use.  Troponins are below the institutional cutoff and the delta is also reassuring.  Strict ER return precautions have been discussed, and patient is agreeing with the plan and is comfortable with the workup done and the recommendations from the ER.   Final Clinical Impression(s) / ED Diagnoses Final diagnoses:  Precordial chest pain  Polysubstance dependence (HCC)  Acute gastritis without hemorrhage, unspecified gastritis type    Rx / DC Orders ED Discharge Orders         Ordered    chlordiazePOXIDE (LIBRIUM) 25 MG capsule        04/23/20 1954           Derwood Kaplan, MD 04/23/20 2116

## 2020-04-23 NOTE — Discharge Instructions (Addendum)
We saw you in the ER for the chest pain/shortness of breath. All of our cardiac workup is normal, including labs, EKG and chest X-RAY are normal. We are not sure what is causing your discomfort, but we suspect that it might be gastritis.  We feel comfortable sending you home at this time, but want you to follow-up with cardiology service to get routine work-up.   We also want you to see your primary care doctor for other maintenance therapy.  Please call the number provided to set up an appointment, you might also want to consider going to the facility to talk to social work to see if they can provide you with extra help.  We have provided you with resources in the community to help you with addiction and also possibly with housing.  Please call those numbers provided.    Substance Abuse Treatment Programs  Intensive Outpatient Programs [facility]     [address]      [place], [place]                   [phone]       [facility] [address] [place] [address] [place], [place] [phone]  [facility]     (Inpatient and outpatient)     [address]           [phone]    [facility] [phone] (Suboxone and Methadone)  [address]      [place], [place] [address]      [phone]       [address] [place], [place] [phone]  [facility] (Outpatient/Inpatient, [facility])    (insurance only) [phone]             [facility] (Groups & Residential) [place], [place] [phone]     [facility]     [address]     [place], [place]      [phone]       [facility] (for caregivers and family) [phone] [address] [place], [place] [phone]      Residential Treatment Programs [facility]      [address] [place], [place] [address]  [phone]       [facility] [address] [place], [place]  [address] [phone]  [facility] [place]        [phone]       [facility] [phone]  [facility] ([facility]             [address]                                         [place], [place]                                                [phone] or [phone]                               [facility] [place] [address] [place], [address] [phone]  [facility]    [address]      [place], [place]  [phone]       [facility] [address] [place], [place] [phone]  [facility]   [address] [place], [place] [address]     [phone]      Admissions: 8am-3pm M-F  [facility] ([facility]) [address] [place], [place] [phone]  [facility]: Residential Program [phone] Days)   [place], [place]      [phone] or [phone]     [facility]: [facility] [place], [place] (Walk in Hours over the weekend or by referral)  [facility] [address] [place], [place], [place] [address] [phone]  Crisis Mobile: [facility]:  [phone] (for crisis response 24 hours a day) [facility] Hotline:      [phone] Outpatient Psychiatry and Counseling  [facility]: Mobile Crisis Management 24 hours:  [phone]  [facility] of the [employer] sliding scale fee and walk in schedule: M-F 8am-12pm/1pm-3pm [address]  [place], [place] [address] [phone]  [facility] [address] [place], [place] [address] [phone]  [facility] ([facility] [employer])- new patient walk-in appointments available Monday - Friday 8am -3pm.          [address] [place], [place] [address] [phone] or crisis line- [phone]  [facility] Out[facility] Services/ Intensive Outpatient Therapy Program [address] [place], [place] [address] [phone]  [facility]                  Crisis Services      [phone] [address]     [place], [place] [address]                 [facility]   [facility] [phone]. [address] [place], [place] [address]   [facility]          [address] [place]  [place], [place] [address]       [phone]  [facility] [address] [place], [place] [address] [phone]  [facility]    [address]    [place], [place] [address]     [phone]       Andee Poles, MD     [address]     [place] [address]     [phone]       [facility] [address] [place] [address]  [facility]     [address] [place], [place]      [phone]       [facility] Eulogio Ditch, MD [address] [place], [place] [address] [phone]  [facility]     [address]     [place], [place] [address]     [phone]       [facility] [address] [place], [place] [address] [phone] Resources for Temporary Residential Assistance/Crisis Centers  [facility] CENT[facility]) M-F 8am-3pm   [address] [place], [place] [address]   [phone] Services include: laundry, barbering, support groups, case management, phone  & computer access, showers, AA/NA mtgs, mental health/substance abuse nurse, job skills class, disability information, VA assistance, spiritual classes, etc.   HOMELESS SHELTERS  [employer]  [facility]   [address] [place]     [phone]              [employer] (women and children)       [address] [place], [place] [address] [phone] [email or web address] for application and  process Application Required  [facility]   [address]    [place] [address]     [phone]                    [facility] [place] [address] [place], [place] [address] [phone] application appt.) Application Required  [facility] (women only)    [address]     [place], [place] [address]     [phone]      Intake starts 6pm daily Need valid ID, SSC, & Police report [employer] [address] [place], [place] [phone] Application Required  [employer] (men only)     [address]      [place], [place]     [phone]       [facility] of the [place] (Pregnant women only) [address] [place], [place] [phone]  [facility]      [address]      [place], [place] [address]     [phone]             [facility] [address] [place], [place] [phone] 90 day commitment/SA/Application process  [facility](men only)     [address]     [place], [place]     [phone]       Check-in at [facility] [address] [place], [place] [address] [phone] Men/Women/Women and Children must be there by 7 pm  [facility] [place], [place] [phone]

## 2020-04-23 NOTE — ED Triage Notes (Signed)
Pt presents with cp onset while lying down since last night. Pain started on the R side, now has migrated to only the L. Pain intermittent, worse with movement and inspiration. 324 ASA and 4 nitro given without relief.

## 2020-04-24 LAB — SARS CORONAVIRUS 2 (TAT 6-24 HRS): SARS Coronavirus 2: NEGATIVE

## 2020-04-24 MED FILL — CHLORDIAZEPOXIDE 25 MG CAP: 25 | 3 days supply | Qty: 10 | Fill #0

## 2020-04-26 ENCOUNTER — Other Ambulatory Visit: Payer: Self-pay | Admitting: Pharmacist

## 2020-04-26 MED ORDER — METOPROLOL TARTRATE 25 MG PO TABS: 25.0000 mg | ORAL_TABLET | Freq: Two times a day (BID) | ORAL | 0 refills | Status: DC

## 2020-04-26 MED ORDER — LISINOPRIL 30 MG PO TABS: 30.0000 mg | ORAL_TABLET | Freq: Every day | ORAL | 0 refills | Status: DC

## 2020-04-26 MED FILL — METOPROLOL TARTRATE 25 MG T: 25 | 7 days supply | Qty: 14 | Fill #0

## 2020-04-26 MED FILL — LISINOPRIL 30 MG TABS: 30 | 7 days supply | Qty: 7 | Fill #0

## 2020-05-02 ENCOUNTER — Ambulatory Visit: Payer: Medicaid Other | Admitting: Critical Care Medicine

## 2020-05-02 NOTE — Progress Notes (Deleted)
Subjective:    Patient ID: Jacob Hopkins, male    DOB: 05-02-1955, 65 y.o.   MRN: [id number]  66 y.o.M former [facility] PCP here to re est care, last seen here in 2020  Hx COPD, HTN, T2DM, Hep C, Tobacco and ETOH use Cocaine use  Pt in ED 04/23/20: HPI: A 65 year old patient with a history of CVA and hypertension presents for evaluation of chest pain. Initial onset of pain was more than 6 hours ago. The patient's chest pain is worse with exertion. The patient reports some diaphoresis. The patient's chest pain is not middle- or left-sided, is not well-localized, is not described as heaviness/pressure/tightness, is not sharp and does not radiate to the arms/jaw/neck. The patient does not complain of nausea. The patient has smoked in the past 90 days. The patient has no history of peripheral artery disease, denies any history of treated diabetes, has no relevant family history of coronary artery disease (first degree relative at less than age 72), has no history of hypercholesterolemia and does not have an elevated BMI (>=30).   Patient reports that his current chest pain has been off and on for the last 3 months.  Today the pain is more severe and lasting longer.  The chest pain is different than his heart attack pain.  The pain started on the right side, then it moved to the left side and now it is back on the right side.  The pain is not radiating to the back, neck, shoulders.  The pain improves with belching.  He does not think that the pain is worse with food intake, exertion, cough.  Patient has known history of CAD.  He admits to current use of cocaine and cigarette use.  No new leg pain, weakness, numbness.  Patient also admits to alcoholism and is wanting help with his drug use.  He has not seen his PCP in more than a year. Pt has no hx of PE, DVT and denies any exogenous hormone (testosterone / estrogen) use, long distance travels or surgery in the past 6 weeks, active cancer, recent  immobilization.   HEAR Score: 37                        65 year old male comes in a chief complaint of chest pain.  The pain is atypical, and has been present since this morning.  It has been going off and on since last 3 months, but today it is more severe.  During my assessment patient reports that the pain is much improved.  He had called EMS, they gave him nitro and there was no response to the nitroglycerin.  He has history of CAD and he states that this current pain is different than his previous MI pain.  Pain is not pleuritic, exertional, positional but it is better with him belching.  With patient having ongoing cocaine use, smoking and known history of CAD, ACS is in the differential.  With him having heavy alcohol use history gastritis/PUD, esophageal spasm is in the differential diagnosis.  History is not consistent with PE.  Aortic dissection also considered in the differential however the pain is not radiating to the back, there is no tachycardia, hypertension and patient's pulses are equal and bounding for bilateral upper and lower extremities.  For now I think we will be conservative in our approach and have patient start taking PPI and have him return to his PCP.  Patient is insured, just has  not followed up with community wellness like he did.  He also needs to start seeing cardiologist to ensure that he is getting appropriate medications to protect his heart.  I have put in a consult for peers support and have provided the patient with resources and a prescription for Librium for his alcohol use.  Troponins are below the institutional cutoff and the delta is also reassuring.      Review of Systems     Objective:   Physical Exam        Assessment & Plan:

## 2021-11-29 IMAGING — DX DG CHEST 2V
2 series · 2 of 2 positions shown · non-contrast
Comparison: No pertinent prior exams available for comparison.

CLINICAL DATA: Chest pain. Additional history provided: Chest pain,
shortness of breath, high blood pressure.

EXAM:
CHEST - 2 VIEW

[chest pa]
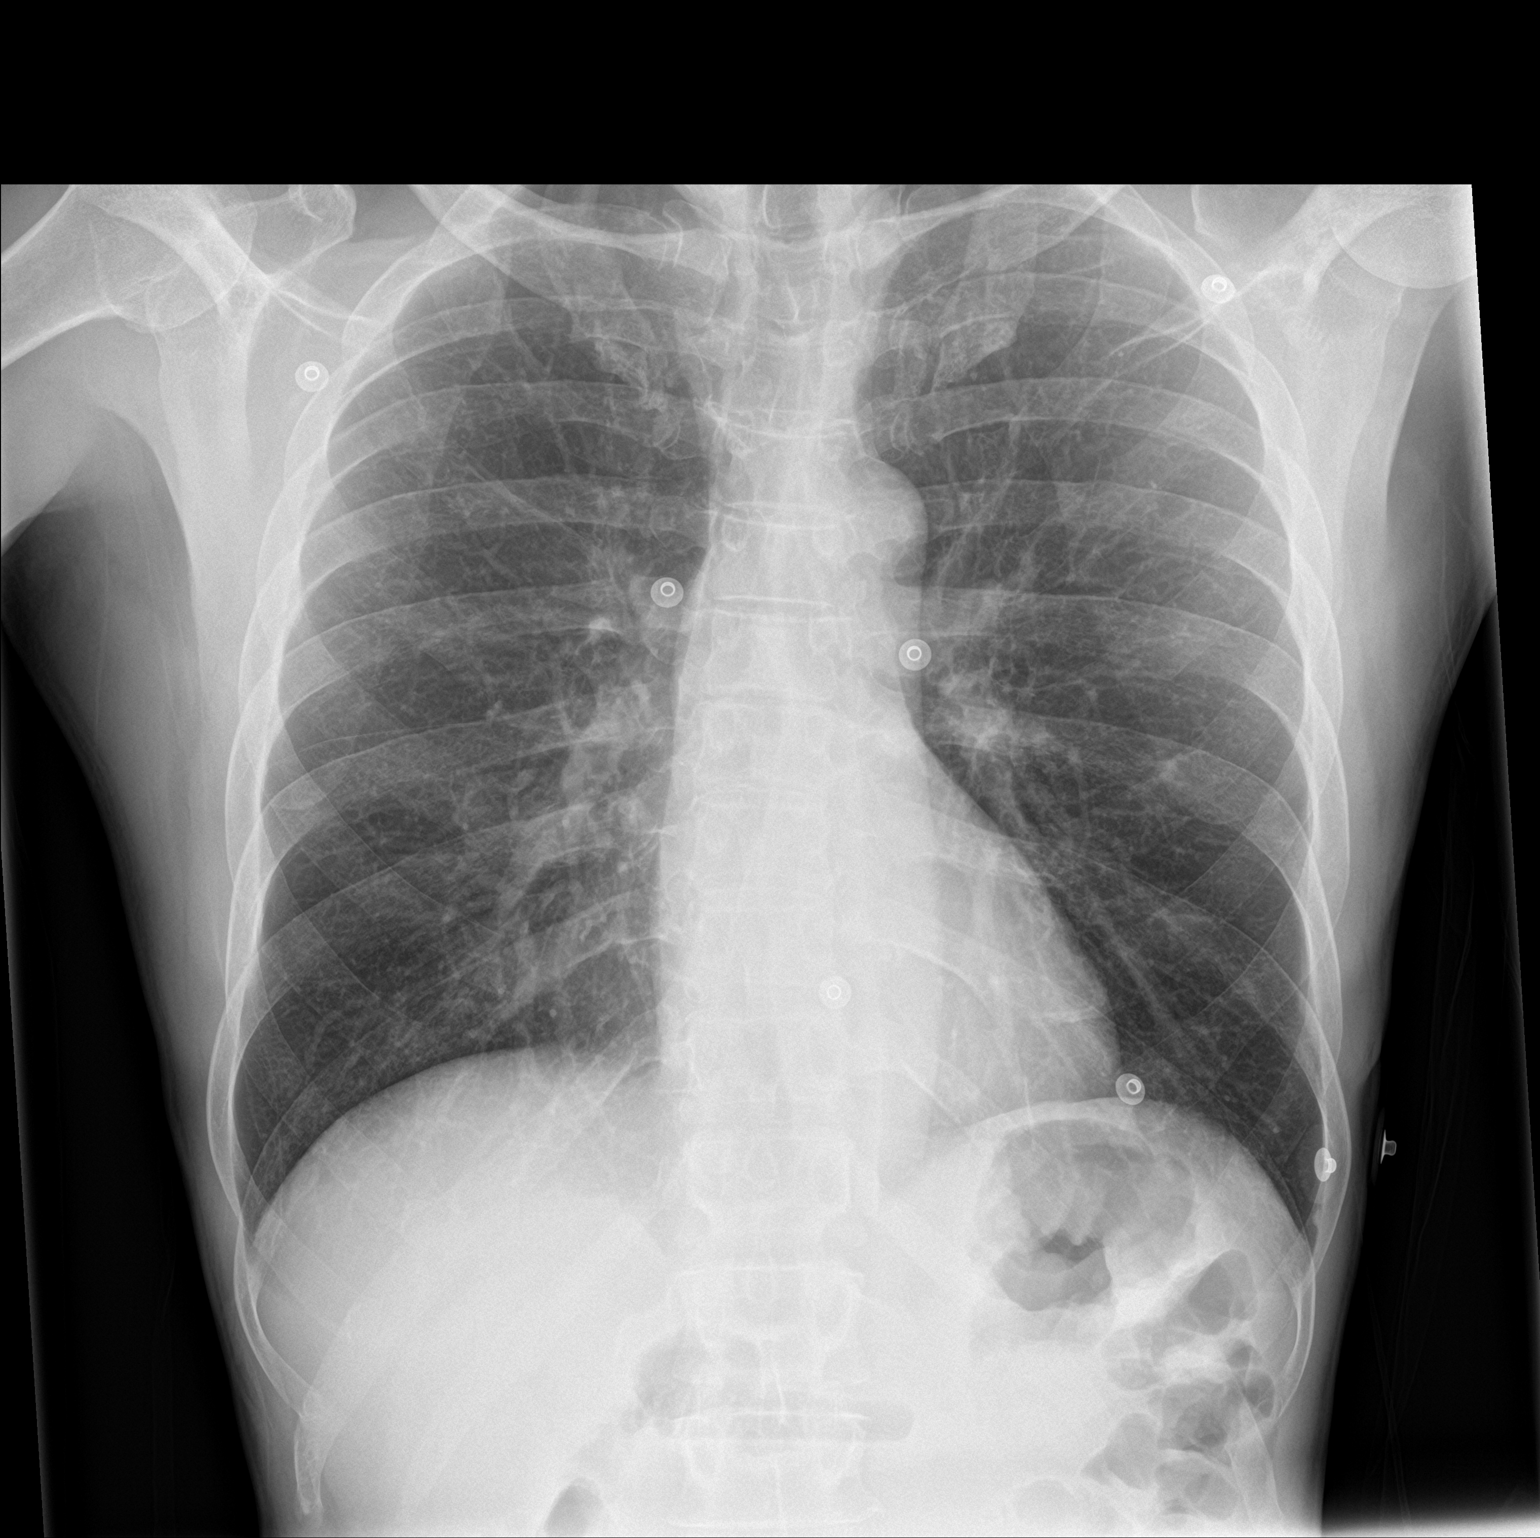

[chest lat]
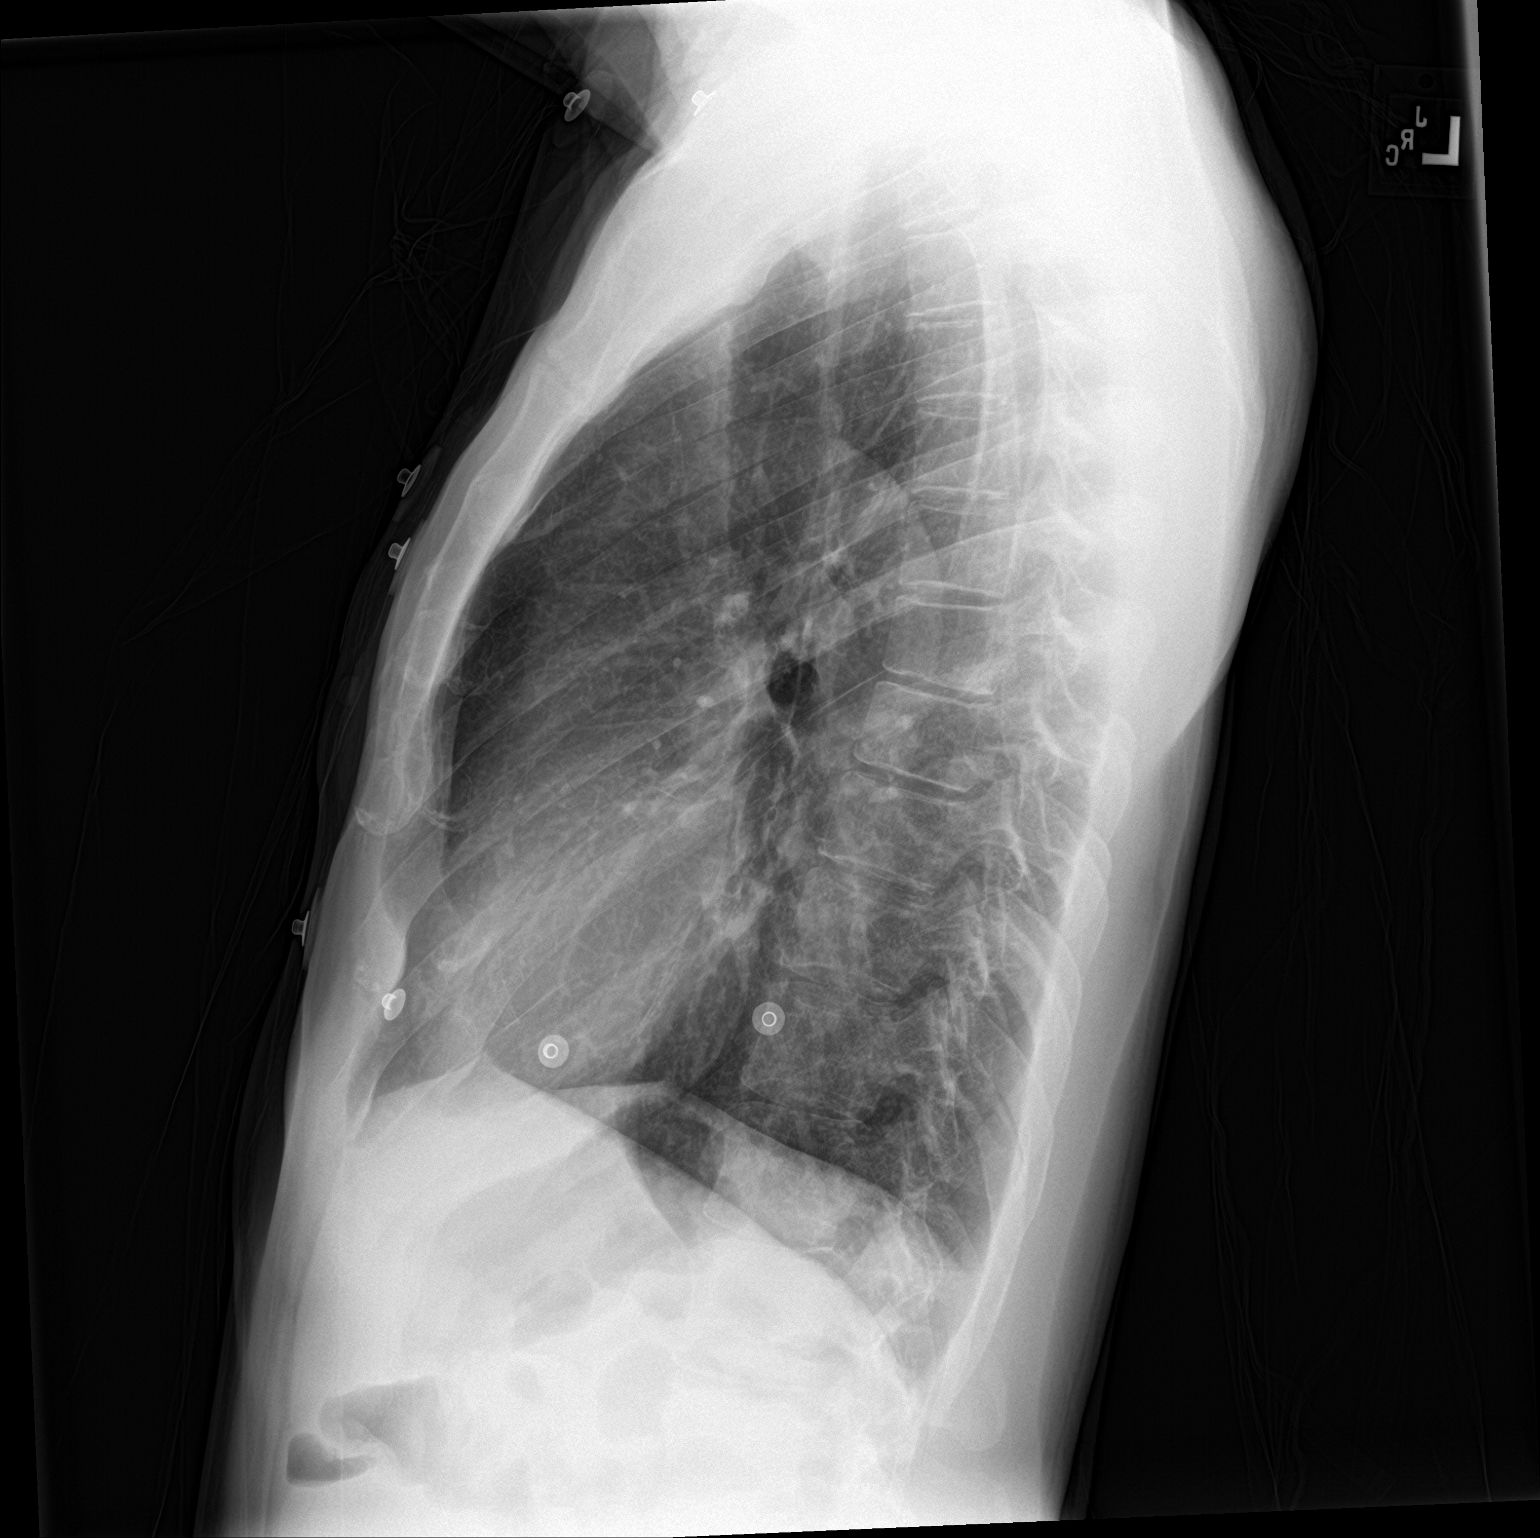

[2 of 2 positions shown; findings below may reference images not displayed]

FINDINGS: Heart size within normal limits. No appreciable airspace
consolidation or pulmonary edema. No evidence of pleural effusion or
pneumothorax. No acute bony abnormality is identified.
IMPRESSION: No evidence of acute cardiopulmonary abnormality.

## 2023-03-26 ENCOUNTER — Other Ambulatory Visit (HOSPITAL_COMMUNITY): Payer: Self-pay
